# Patient Record
Sex: Male | Born: 1960 | Race: White | Hispanic: No | Marital: Married | Smoking: Never smoker
Health system: Southern US, Community
[De-identification: ages and names within clinical notes are randomized; demographics above are authoritative.]

## PROBLEM LIST (undated history)

## (undated) DIAGNOSIS — I340 Nonrheumatic mitral (valve) insufficiency: Secondary | ICD-10-CM

## (undated) DIAGNOSIS — E785 Hyperlipidemia, unspecified: Secondary | ICD-10-CM

## (undated) DIAGNOSIS — Z8249 Family history of ischemic heart disease and other diseases of the circulatory system: Secondary | ICD-10-CM

## (undated) DIAGNOSIS — R519 Headache, unspecified: Secondary | ICD-10-CM

## (undated) DIAGNOSIS — I071 Rheumatic tricuspid insufficiency: Secondary | ICD-10-CM

## (undated) DIAGNOSIS — I517 Cardiomegaly: Secondary | ICD-10-CM

## (undated) DIAGNOSIS — M25569 Pain in unspecified knee: Secondary | ICD-10-CM

## (undated) DIAGNOSIS — J45909 Unspecified asthma, uncomplicated: Secondary | ICD-10-CM

## (undated) DIAGNOSIS — I10 Essential (primary) hypertension: Secondary | ICD-10-CM

## (undated) DIAGNOSIS — E669 Obesity, unspecified: Secondary | ICD-10-CM

## (undated) DIAGNOSIS — Z833 Family history of diabetes mellitus: Secondary | ICD-10-CM

## (undated) DIAGNOSIS — M545 Low back pain, unspecified: Secondary | ICD-10-CM

## (undated) DIAGNOSIS — N200 Calculus of kidney: Secondary | ICD-10-CM

## (undated) DIAGNOSIS — R7309 Other abnormal glucose: Secondary | ICD-10-CM

## (undated) DIAGNOSIS — I2510 Atherosclerotic heart disease of native coronary artery without angina pectoris: Secondary | ICD-10-CM

## (undated) DIAGNOSIS — I4510 Unspecified right bundle-branch block: Secondary | ICD-10-CM

## (undated) DIAGNOSIS — M1990 Unspecified osteoarthritis, unspecified site: Secondary | ICD-10-CM

## (undated) HISTORY — DX: Family history of ischemic heart disease and other diseases of the circulatory system: Z82.49

## (undated) HISTORY — DX: Cardiomegaly: I51.7

## (undated) HISTORY — DX: Hyperlipidemia, unspecified: E78.5

## (undated) HISTORY — DX: Obesity, unspecified: E66.9

## (undated) HISTORY — DX: Nonrheumatic mitral (valve) insufficiency: I34.0

## (undated) HISTORY — DX: Headache, unspecified: R51.9

## (undated) HISTORY — DX: Unspecified osteoarthritis, unspecified site: M19.90

## (undated) HISTORY — DX: Pain in unspecified knee: M25.569

## (undated) HISTORY — DX: Rheumatic tricuspid insufficiency: I07.1

## (undated) HISTORY — DX: Other abnormal glucose: R73.09

## (undated) HISTORY — DX: Atherosclerotic heart disease of native coronary artery without angina pectoris: I25.10

## (undated) HISTORY — DX: Unspecified asthma, uncomplicated: J45.909

## (undated) HISTORY — DX: Unspecified right bundle-branch block: I45.10

## (undated) HISTORY — DX: Family history of diabetes mellitus: Z83.3

## (undated) HISTORY — DX: Calculus of kidney: N20.0

## (undated) HISTORY — DX: Low back pain, unspecified: M54.50

---
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
## 2016-06-05 ENCOUNTER — Encounter (HOSPITAL_COMMUNITY): Payer: Self-pay | Admitting: Emergency Medicine

## 2016-06-05 ENCOUNTER — Emergency Department (HOSPITAL_COMMUNITY)
Admission: EM | Admit: 2016-06-05 | Discharge: 2016-06-06 | Disposition: A | Discharge status: Home / Self Care | Payer: Managed Care, Other (non HMO) | Attending: Emergency Medicine | Admitting: Emergency Medicine

## 2016-06-05 DIAGNOSIS — M79661 Pain in right lower leg: Secondary | ICD-10-CM

## 2016-06-05 DIAGNOSIS — I10 Essential (primary) hypertension: Secondary | ICD-10-CM | POA: Diagnosis not present

## 2016-06-05 DIAGNOSIS — M79604 Pain in right leg: Secondary | ICD-10-CM | POA: Insufficient documentation

## 2016-06-05 DIAGNOSIS — Z79899 Other long term (current) drug therapy: Secondary | ICD-10-CM | POA: Insufficient documentation

## 2016-06-05 HISTORY — DX: Essential (primary) hypertension: I10

## 2016-06-05 NOTE — ED Triage Notes (Signed)
Pt. Stated, I was on a flight on Saturday and when I departed when getting off I had a rt. Leg calf cramp for about 3 hours. Since then I've had a little bit of pain in the right calf.  Before the flight I was driving in he car for 7 hours without getting out , that was on the Jan. 14.

## 2016-06-05 NOTE — ED Notes (Signed)
See EDP assessment 

## 2016-06-05 NOTE — ED Provider Notes (Signed)
[facility]-EMERGENCY DEPT Provider Note     By signing my name below, I, Earmon PhoenixJennifer Waddell, attest that this documentation has been prepared under the direction and in the presence of Felicie Mornavid Elle Vezina, FNP. Electronically Signed: Earmon PhoenixJennifer Waddell, ED Scribe. 06/05/16. 11:43 PM.    History   Chief Complaint Chief Complaint  Patient presents with  . Leg Pain     The history is provided by the patient and medical records. No language interpreter was used.    Greg Graham is a 56 y.o. male with PMHx of HTN who presents to the Emergency Department complaining of right calf cramping pain that began about five days ago. He reports an associated knot in the right calf. He states he was on a short airplane ride five days ago and shortly afterwards he started experiencing the pain. He states approximately two weeks ago he had a 7 hour car ride without stopping to stretch or walk. He reports a one time, short lived episode of pain (he described as a "stinger") from the right side of his chest to his head that quickly resolved. He has not taken anything for pain but states his wife massaged the calf and that seemed to resolve the symptoms temporarily. Pt denies modifying factors. He denies fever, chills, nausea, vomiting, SOB, diaphoresis.   Past Medical History:  Diagnosis Date  . Hypertension     There are no active problems to display for this patient.   History reviewed. No pertinent surgical history.     Home Medications    Prior to Admission medications   Not on File    Family History No family history on file.  Social History Social History  Substance Use Topics  . Smoking status: Never Smoker  . Smokeless tobacco: Never Used  . Alcohol use Yes     Allergies   Patient has no allergy information on record.   Review of Systems Review of Systems  Constitutional: Negative for chills, diaphoresis and fever.  Respiratory: Negative for shortness of breath.     Gastrointestinal: Negative for nausea and vomiting.  Musculoskeletal: Positive for myalgias.  All other systems reviewed and are negative.    Physical Exam Updated Vital Signs BP 139/100 (BP Location: Left Arm)   Pulse 70   Temp 98.6 F (37 C)   Resp 16   Ht 6' (1.829 m)   Wt 220 lb (99.8 kg)   SpO2 97%   BMI 29.84 kg/m   Physical Exam  Constitutional: He is oriented to person, place, and time. He appears well-developed and well-nourished.  HENT:  Head: Normocephalic and atraumatic.  Neck: Normal range of motion.  Cardiovascular: Normal rate.   Pulmonary/Chest: Effort normal.  Musculoskeletal: Normal range of motion. He exhibits no edema, tenderness or deformity.  Negative Homan's sign. No significant tenderness of the right calf. No bruising or erythema.  Neurological: He is alert and oriented to person, place, and time.  Skin: Skin is warm and dry.  Psychiatric: He has a normal mood and affect. His behavior is normal.  Nursing note and vitals reviewed.    ED Treatments / Results  DIAGNOSTIC STUDIES: Oxygen Saturation is 97% on RA, normal by my interpretation.   COORDINATION OF CARE: 11:42 PM- Will order d-dimer. Pt verbalizes understanding and agrees to plan.  Medications - No data to display  Labs (all labs ordered are listed, but only abnormal results are displayed) Labs Reviewed  D-DIMER, QUANTITATIVE (NOT AT [facility])    EKG  EKG Interpretation None  Radiology No results found.  Procedures Procedures (including critical care time)  Medications Ordered in ED Medications - No data to display   Initial Impression / Assessment and Plan / ED Course  I have reviewed the triage vital signs and the nursing notes.  Pertinent labs & imaging results that were available during my care of the patient were reviewed by me and considered in my medical decision making (see chart for details).  Patient with right calf pain.  He endorses frequent air  travel and recent long distance auto trip.  Negative homans' and negative d-dimer.  No dyspnea or tachycardia. Pt advised to follow up with primary care. Patient will be discharged home & is agreeable with above plan. Returns precautions discussed. Pt appears safe for discharge.    I personally performed the services described in this documentation, which was scribed in my presence. The recorded information has been reviewed and is accurate.  Final Clinical Impressions(s) / ED Diagnoses   Final diagnoses:  Right calf pain    New Prescriptions There are no discharge medications for this patient.    Felicie Morn, NP 06/06/16 1610    Linwood Dibbles, MD 06/06/16 1230

## 2016-06-06 LAB — D-DIMER, QUANTITATIVE: D-Dimer, Quant: <0.27 ug/mL-FEU (ref 0.00–0.50)

## 2018-10-11 ENCOUNTER — Telehealth: Payer: Self-pay

## 2018-10-11 NOTE — Telephone Encounter (Signed)
Called patient to see about setting up an appointment. Patient is scheduled for 10/13/18 with Dr. Eden Emms. Left message stating if this is not correct to call back.

## 2018-10-11 NOTE — Progress Notes (Signed)
CARDIOLOGY CONSULT NOTE       Patient ID: Greg Graham MRN: [id number] DOB/AGE: 57/07/1960 57 y.o.  Admit date: (Not on file) Referring Physician: Dr Nelly RoutBrewster Primary Physician: Patient, No Pcp Per Primary Cardiologist: New/Rubel Heckard Reason for Consultation: CAD  Active Problems:   * No active hospital problems. *   HPI:  58 y.o. being seen for CAD. Previously seen by Dr Archer AsaMiriam Cohen June [facility] [place]. Note review indicates he has a family history of CAD father with MI 1438 CABG 3440 CRF;s also include HTN, HLD. ECG with chronic RBBB. 2013 had calcium score total 18 mostly in LAD and 2 in RCA Last myovue 2014 was normal Echo 2009 with mild MR mild LVH Notes repeatedly make note of "stress" and poor lifestyle habits.  In my interview he seems better. He does exercise. He is not taking atenolol and only ACE for BP which home readings have been fine. Two older children doing well On occasion gets muscular sounding pains in chest with activity   ROS All other systems reviewed and negative except as noted above  Past Medical History:  Diagnosis Date  . CAD (coronary artery disease)   . Frequent headaches   . Hyperlipidemia   . Hypertension   . Mitral regurgitation   . Tricuspid regurgitation     Family History  Problem Relation Age of Onset  . Heart attack Father 2638  . Heart disease Father 1340       CABG    Social History   Socioeconomic History  . Marital status: Married    Spouse name: Not on file  . Number of children: Not on file  . Years of education: Not on file  . Highest education level: Not on file  Occupational History  . Not on file  Social Needs  . Financial resource strain: Not on file  . Food insecurity:    Worry: Not on file    Inability: Not on file  . Transportation needs:    Medical: Not on file    Non-medical: Not on file  Tobacco Use  . Smoking status: Never Smoker  . Smokeless tobacco: Never Used  Substance and  Sexual Activity  . Alcohol use: Yes  . Drug use: No  . Sexual activity: Not on file  Lifestyle  . Physical activity:    Days per week: Not on file    Minutes per session: Not on file  . Stress: Not on file  Relationships  . Social connections:    Talks on phone: Not on file    Gets together: Not on file    Attends religious service: Not on file    Active member of club or organization: Not on file    Attends meetings of clubs or organizations: Not on file    Relationship status: Not on file  . Intimate partner violence:    Fear of current or ex partner: Not on file    Emotionally abused: Not on file    Physically abused: Not on file    Forced sexual activity: Not on file  Other Topics Concern  . Not on file  Social History Narrative  . Not on file    No past surgical history on file.      Physical Exam: There were no vitals taken for this visit.    Affect appropriate Healthy:  appears stated age HEENT: normal Neck supple with no adenopathy JVP normal no bruits no thyromegaly Lungs clear with  no wheezing and good diaphragmatic motion Heart:  S1/S2 no murmur, no rub, gallop or click PMI normal Abdomen: benighn, BS positve, no tenderness, no AAA no bruit.  No HSM or HJR Distal pulses intact with no bruits No edema Neuro non-focal Skin warm and dry No muscular weakness   Labs:  No results found for: WBC, HGB, HCT, MCV, PLT No results for input(s): NA, K, CL, CO2, BUN, CREATININE, CALCIUM, PROT, BILITOT, ALKPHOS, ALT, AST, GLUCOSE in the last 168 hours.  Invalid input(s): LABALBU No results found for: CKTOTAL, CKMB, CKMBINDEX, TROPONINI No results found for: CHOL No results found for: HDL No results found for: LDLCALC No results found for: TRIG No results found for: CHOLHDL No results found for: LDLDIRECT    Radiology: No results found.  EKG:  NSR RBBB    ASSESSMENT AND PLAN:   CAD:  Sub clinical in middle aged male with HTN, HLD and family history  Atypical chest pain Given family history will order calcium score with cardiac CTA to define anatomy and risk stratify He had been getting nuclear stress tests which have much higher radiation dose   HLD:  Labs from 03/2018 LDL-P 1649  LDL 145  Will repeat today discussed lower targets   HTN:  Well controlled.  Continue current medications and low sodium Dash type diet.    MR:  Mild by echo 2009  No murmur no need to repeat echo at this time   Signed: Charlton Haws 10/13/2018, 7:47 AM

## 2018-10-11 NOTE — Telephone Encounter (Signed)
Left message for patient to call back. Need to schedule an appointment with Dr. Eden Emms is in the office.

## 2018-10-13 ENCOUNTER — Encounter: Payer: Self-pay | Admitting: Cardiovascular Disease

## 2018-10-13 ENCOUNTER — Ambulatory Visit (INDEPENDENT_AMBULATORY_CARE_PROVIDER_SITE_OTHER): Payer: Managed Care, Other (non HMO) | Admitting: Cardiovascular Disease

## 2018-10-13 ENCOUNTER — Other Ambulatory Visit: Payer: Self-pay

## 2018-10-13 VITALS — BP 104/72 | HR 73 | Ht 72.0 in | Wt 233.6 lb

## 2018-10-13 DIAGNOSIS — R079 Chest pain, unspecified: Secondary | ICD-10-CM

## 2018-10-13 DIAGNOSIS — R0789 Other chest pain: Secondary | ICD-10-CM

## 2018-10-13 DIAGNOSIS — E782 Mixed hyperlipidemia: Secondary | ICD-10-CM | POA: Diagnosis not present

## 2018-10-13 DIAGNOSIS — I10 Essential (primary) hypertension: Secondary | ICD-10-CM | POA: Diagnosis not present

## 2018-10-13 DIAGNOSIS — E785 Hyperlipidemia, unspecified: Secondary | ICD-10-CM

## 2018-10-13 DIAGNOSIS — Z8249 Family history of ischemic heart disease and other diseases of the circulatory system: Secondary | ICD-10-CM

## 2018-10-13 LAB — LIPID PANEL
Chol/HDL Ratio: 2.7 ratio (ref 0.0–5.0)
Cholesterol, Total: 119 mg/dL (ref 100–199)
HDL: 44 mg/dL (ref >39)
LDL Calculated: 66 mg/dL (ref 0–99)
Triglycerides: 44 mg/dL (ref 0–149)
VLDL Cholesterol Cal: 9 mg/dL (ref 5–40)

## 2018-10-13 LAB — BASIC METABOLIC PANEL
BUN/Creatinine Ratio: 11 (ref 9–20)
BUN: 14 mg/dL (ref 6–24)
CO2: 21 mmol/L (ref 20–29)
Calcium: 9.2 mg/dL (ref 8.7–10.2)
Chloride: 105 mmol/L (ref 96–106)
Creatinine, Ser: 1.32 mg/dL — ABNORMAL HIGH (ref 0.76–1.27)
GFR calc Af Amer: 69 mL/min/{1.73_m2} (ref >59)
GFR calc non Af Amer: 59 mL/min/{1.73_m2} — ABNORMAL LOW (ref >59)
Glucose: 105 mg/dL — ABNORMAL HIGH (ref 65–99)
Potassium: 4.7 mmol/L (ref 3.5–5.2)
Sodium: 142 mmol/L (ref 134–144)

## 2018-10-13 LAB — HEPATIC FUNCTION PANEL
ALT: 44 IU/L (ref 0–44)
AST: 27 IU/L (ref 0–40)
Albumin: 4.5 g/dL (ref 3.8–4.9)
Alkaline Phosphatase: 100 IU/L (ref 39–117)
Bilirubin Total: 0.4 mg/dL (ref 0.0–1.2)
Bilirubin, Direct: 0.14 mg/dL (ref 0.00–0.40)
Total Protein: 6.8 g/dL (ref 6.0–8.5)

## 2018-10-13 NOTE — Patient Instructions (Addendum)
Medication Instructions:   If you need a refill on your cardiac medications before your next appointment, please call your pharmacy.   Lab work: Your physician recommends that you have lab work today- Lipid and Liver panel.  If you have labs (blood work) drawn today and your tests are completely normal, you will receive your results only by: Marland Kitchen MyChart Message (if you have MyChart) OR . A paper copy in the mail If you have any lab test that is abnormal or we need to change your treatment, we will call you to review the results.  Testing/Procedures: Your physician has requested that you have cardiac CT as soon as possible. Cardiac computed tomography (CT) is a painless test that uses an x-ray machine to take clear, detailed pictures of your heart. For further information please visit [email or web address] Please follow instruction sheet as given.  Follow-Up: At [facility], you and your health needs are our priority.  As part of our continuing mission to provide you with exceptional heart care, we have created designated Provider Care Teams.  These Care Teams include your primary Cardiologist (physician) and Advanced Practice Providers (APPs -  Physician Assistants and Nurse Practitioners) who all work together to provide you with the care you need, when you need it. You will need a follow up appointment in 12 months.  Please call our office 2 months in advance to schedule this appointment.  You may see Dr. Eden Emms or one of the following Advanced Practice Providers on your designated Care Team:   Norma Fredrickson, NP Nada Boozer, NP . Georgie Chard, NP     Please arrive at the [facility] main entrance of [facility] at xx:xx AM (30-45 minutes prior to test start time)  [facility] [address] [place], [place] [address] [phone]  Proceed to the [facility] Radiology Department (First Floor).  Please follow these instructions carefully (unless otherwise  directed):  Hold all erectile dysfunction medications at least 48 hours prior to test.  On the Night Before the Test: . Be sure to Drink plenty of water. . Do not consume any caffeinated/decaffeinated beverages or chocolate 12 hours prior to your test. . Do not take any antihistamines 12 hours prior to your test. . Take Atenolol 25 mg the night before your test.  On the Day of the Test: . Drink plenty of water. Do not drink any water within one hour of the test. . Do not eat any food 4 hours prior to the test. . You may take your regular medications prior to the test.  . Take Atenolol 50 mg two hours prior to test.      After the Test: . Drink plenty of water. . After receiving IV contrast, you may experience a mild flushed feeling. This is normal. . On occasion, you may experience a mild rash up to 24 hours after the test. This is not dangerous. If this occurs, you can take Benadryl 25 mg and increase your fluid intake. . If you experience trouble breathing, this can be serious. If it is severe call 911 IMMEDIATELY. If it is mild, please call our office.

## 2018-10-15 NOTE — Telephone Encounter (Signed)
Patient was seen in office on 10/13/18

## 2018-10-18 ENCOUNTER — Telehealth: Payer: Self-pay | Admitting: Cardiovascular Disease

## 2018-10-18 DIAGNOSIS — E785 Hyperlipidemia, unspecified: Secondary | ICD-10-CM

## 2018-10-18 NOTE — Telephone Encounter (Signed)
Follow up: ° ° °Patient returning call  ° ° ° °

## 2018-10-18 NOTE — Telephone Encounter (Signed)
Left message for patient to call back  

## 2018-10-18 NOTE — Telephone Encounter (Signed)
Patient aware of lab results. Per Dr. Johnsie Cancel, LDL is at goal and TC is much lower on crestor now only 119 f/u same labs in 6 months. Patient will come in on 12/8 for fasting lipid and liver panel. Patient also stated he is taking crestor 5 mg by mouth daily. Will update patient's medication list. Patient was wondering about the schedule of his Cardiac CT will send message to scheduling.

## 2018-10-18 NOTE — Telephone Encounter (Signed)
New Message    Pt is returning call for results   Please call back  

## 2018-10-21 ENCOUNTER — Telehealth (HOSPITAL_COMMUNITY): Payer: Self-pay | Admitting: Emergency Medicine

## 2018-10-21 NOTE — Telephone Encounter (Signed)
Left message on voicemail with name and callback number Lima Chillemi RN Navigator Cardiac Imaging [facility] [phone] Office [phone] Cell  

## 2018-10-21 NOTE — Telephone Encounter (Signed)
Reaching out to patient to offer assistance regarding upcoming cardiac imaging study; pt verbalizes understanding of appt date/time, parking situation and where to check in, pre-test NPO status and medications ordered, and verified current allergies; name and call back number provided for further questions should they arise Sham Alviar RN Navigator Cardiac Imaging [facility] [phone] office [phone] cell  Pt denies covid symptoms, verbalized understanding of visitor policy. 

## 2018-10-22 ENCOUNTER — Ambulatory Visit (HOSPITAL_COMMUNITY)
Admission: RE | Admit: 2018-10-22 | Discharge: 2018-10-22 | Disposition: A | Discharge status: Home / Self Care | Payer: Managed Care, Other (non HMO) | Source: Ambulatory Visit | Attending: Cardiovascular Disease | Admitting: Cardiovascular Disease

## 2018-10-22 ENCOUNTER — Ambulatory Visit (HOSPITAL_COMMUNITY): Payer: Managed Care, Other (non HMO)

## 2018-10-22 ENCOUNTER — Telehealth: Payer: Self-pay | Admitting: Cardiovascular Disease

## 2018-10-22 ENCOUNTER — Other Ambulatory Visit: Payer: Self-pay

## 2018-10-22 DIAGNOSIS — R0789 Other chest pain: Secondary | ICD-10-CM | POA: Insufficient documentation

## 2018-10-22 DIAGNOSIS — R079 Chest pain, unspecified: Secondary | ICD-10-CM

## 2018-10-22 MED ORDER — IOHEXOL 350 MG/ML SOLN
80.0000 mL | Freq: Once | INTRAVENOUS | Status: AC | PRN
  Administered 2018-10-22: 80 mL via INTRAVENOUS

## 2018-10-22 MED ORDER — NITROGLYCERIN 0.4 MG SL SUBL
0.8000 mg | SUBLINGUAL_TABLET | Freq: Once | SUBLINGUAL | Status: AC
  Administered 2018-10-22: 0.8 mg via SUBLINGUAL
  Filled 2018-10-22: qty 25

## 2018-10-22 MED ORDER — NITROGLYCERIN 0.4 MG SL SUBL
SUBLINGUAL_TABLET | SUBLINGUAL | Status: AC
  Filled 2018-10-22: qty 2

## 2018-10-22 NOTE — Telephone Encounter (Signed)
Patient is returning your call.  

## 2018-10-22 NOTE — Telephone Encounter (Signed)
Called patient with CT results per Dr. Johnsie Cancel.  Notes recorded by Josue Hector, MD on 10/22/2018 at 12:54 PM EDT  Non obstructive CAD. Continue ASA and statin LDL is good calcium score high for age 57 th percentile Consider f/u stress test in a year

## 2019-04-19 ENCOUNTER — Other Ambulatory Visit: Payer: Managed Care, Other (non HMO)

## 2021-04-04 IMAGING — CT CT HEAR MORPH WITH CTA COR WITH SCORE WITH CA WITH CONTRAST AND
4 of 7 series · 8 of 20 positions shown, 9 images · IV contrast (APPLIED)
Comparison: None.
COMPARISON: None.

Addendum:
EXAM:
OVER-READ INTERPRETATION  CT CHEST

The following report is an over-read performed by radiologist Dr.
Leichtenberg Raach [REDACTED] on 10/22/2018. This
over-read does not include interpretation of cardiac or coronary
anatomy or pathology. The coronary calcium score/coronary CTA
interpretation by the cardiologist is attached.
CLINICAL DATA: Chest pain
Cardiac CTA
MEDICATIONS:
Sub lingual nitro. 4 mg and lopressor 5mg
TECHNIQUE: The patient was scanned on a Siemens Force 192 scanner. Gantry
rotation speed was 250 msecs. Collimation was. 6 mm . A 120 kV
prospective scan was triggered in the ascending thoracic aorta at
140 HU's with full mA between 30-70% of the R-R interval . Average
HR during the scan was 59 bpm. The 3D data set was interpreted on a
dedicated work station using MPR, MIP and VRT modes. A total of 80
cc of contrast was used.

[Series 6: best diast 75 % · axial · 0.40mm/px · z∈[+1184,+1233]mm · 2 of 368 slices shown, 3 images]
[im 123/368  vessel]
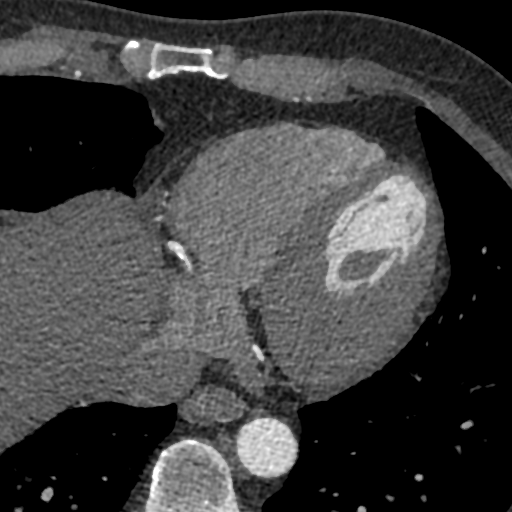
[im 123/368  lung]
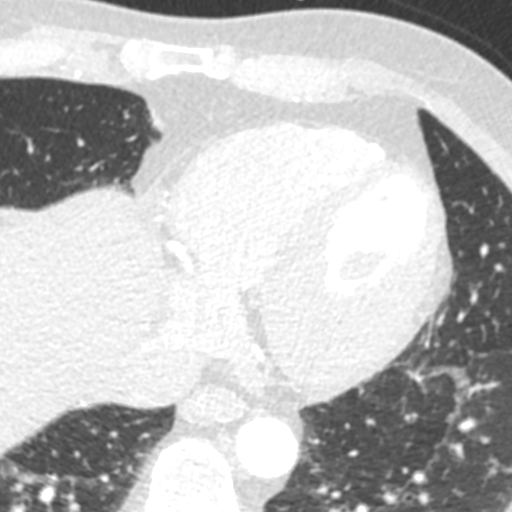
[im 245/368  vessel]
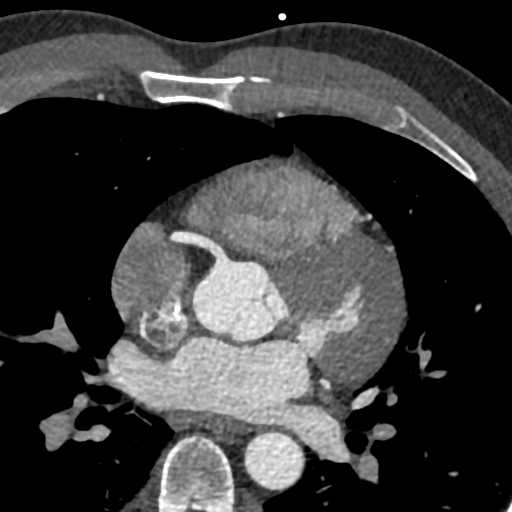

[Series 7: best syst 37 % · axial · 0.40mm/px · z∈[+1184,+1233]mm · 2 of 368 slices shown]
[im 123/368  vessel]
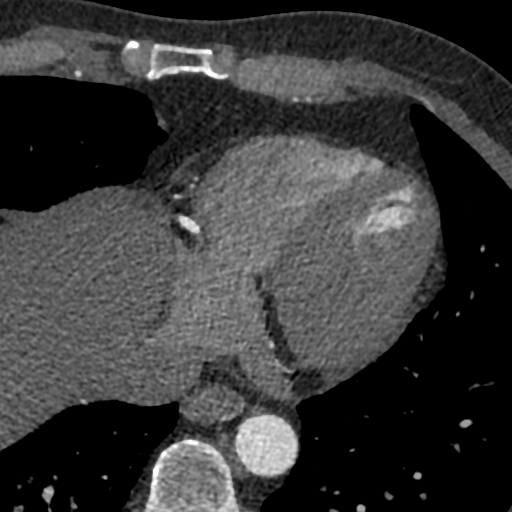
[im 245/368  vessel]
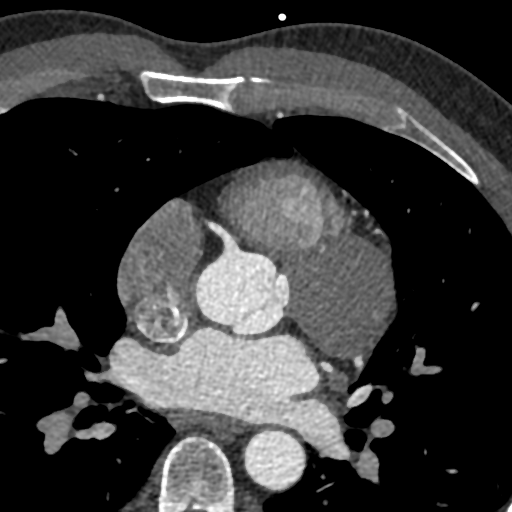

[Series 8: ts diast sharp 75 % · axial · 0.40mm/px · z∈[+1184,+1233]mm · 2 of 368 slices shown]
[im 123/368  lung]
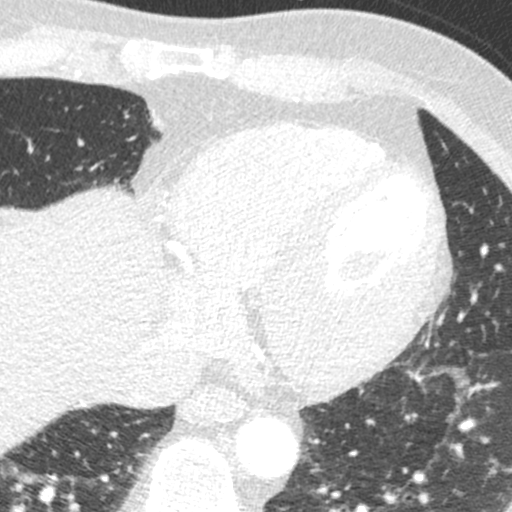
[im 245/368  lung]
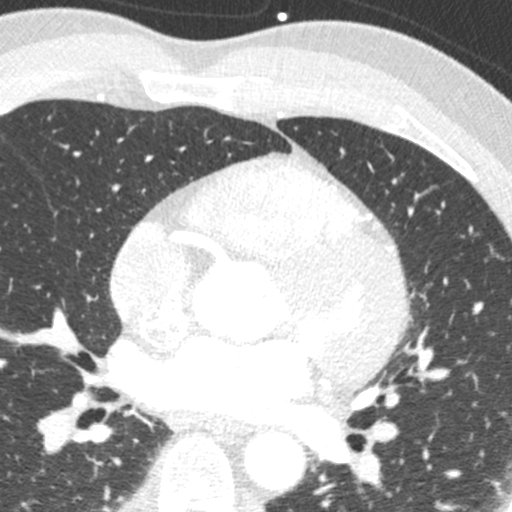

[Series 9: ts syst sharp 37 % · axial · 0.40mm/px · z∈[+1184,+1233]mm · 2 of 368 slices shown]
[im 123/368  lung]
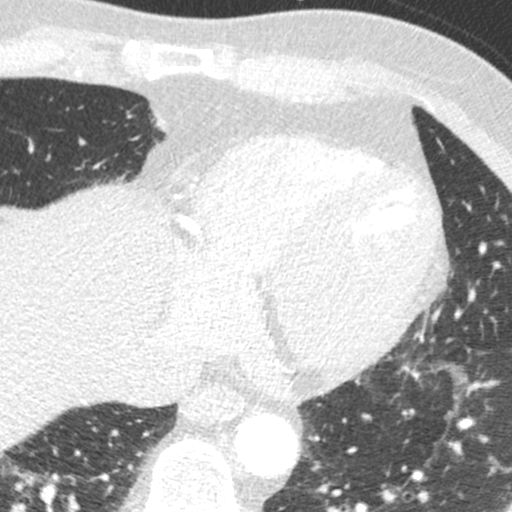
[im 245/368  lung]
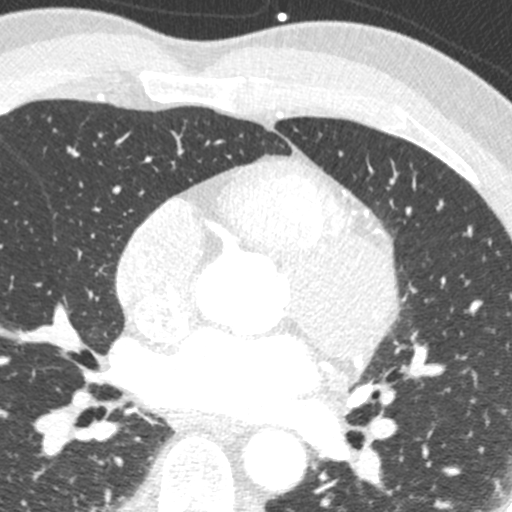

[8 of 20 positions shown; findings below may reference images not displayed]

FINDINGS: Within the visualized portions of the thorax there are no suspicious
appearing pulmonary nodules or masses, there is no acute
consolidative airspace disease, no pleural effusions, no
pneumothorax and no lymphadenopathy. Visualized portions of the
upper abdomen are unremarkable. There are no aggressive appearing
lytic or blastic lesions noted in the visualized portions of the
skeleton.
IMPRESSION: No significant incidental noncardiac findings are noted.
FINDINGS: Non-cardiac: See separate report from [REDACTED]. No
significant findings on limited lung and soft tissue windows.

Calcium score: Calcium noted in proximal LAD and distal RCA

Coronary Arteries: Right dominant with no anomalies

LM: Normal

LAD: 25-49% mixed plaque with some external remodeling in proximal
LAD

IM: Normal

D1: Normal

D2: Normal

Circumflex: Normal

OM1: Normal

AV Groove Normal

RCA: 1-24% calcific plaque in distal vessel

PDA: Normal

PLA: Normal
IMPRESSION: 1. Calcium score 116 which is 77 th percentile for age and sex

2. Non obstructive CAD RADS 2 disease in LAD and RCA see description
above

3.  Normal aortic root diameter 3.4 cm

Divina Lemoine

*** End of Addendum ***
EXAM:
OVER-READ INTERPRETATION  CT CHEST

The following report is an over-read performed by radiologist Dr.
Leichtenberg Raach [REDACTED] on 10/22/2018. This
over-read does not include interpretation of cardiac or coronary
anatomy or pathology. The coronary calcium score/coronary CTA
interpretation by the cardiologist is attached.
FINDINGS: Within the visualized portions of the thorax there are no suspicious
appearing pulmonary nodules or masses, there is no acute
consolidative airspace disease, no pleural effusions, no
pneumothorax and no lymphadenopathy. Visualized portions of the
upper abdomen are unremarkable. There are no aggressive appearing
lytic or blastic lesions noted in the visualized portions of the
skeleton.
IMPRESSION: No significant incidental noncardiac findings are noted.
